# Patient Record
Sex: Male | Born: 1950 | Race: White | Hispanic: No | Marital: Married
Health system: Southern US, Community
[De-identification: ages and names within clinical notes are randomized; demographics above are authoritative.]

---

## 2008-02-09 ENCOUNTER — Emergency Department (HOSPITAL_COMMUNITY): Admission: EM | Admit: 2008-02-09 | Discharge: 2008-02-09 | Payer: Self-pay | Admitting: Family Medicine

## 2009-09-02 IMAGING — CT CT ABDOMEN W/O CM
2 of 4 series · 17 of 46 positions shown, 19 images · non-contrast
Comparison: None

CT ABDOMEN

CLINICAL DATA: Acute onset of severe left testicular pain

CT ABDOMEN AND PELVIS WITHOUT CONTRAST
TECHNIQUE: Multidetector CT imaging of the abdomen and pelvis was
performed following the standard
protocol without intravenous contrast.

[Series 2: <(id) w/o a/p >(id) · axial · non-contrast · 0.70mm/px · z∈[-475,-80]mm · 14 of 87 slices shown, 16 images]
[im 4/87  soft-tissue]
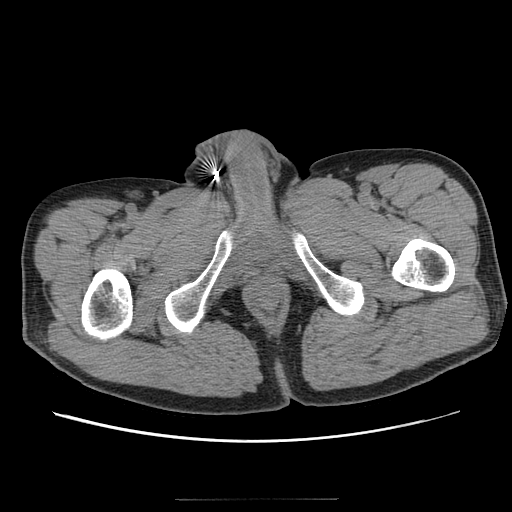
[im 4/87  bone]
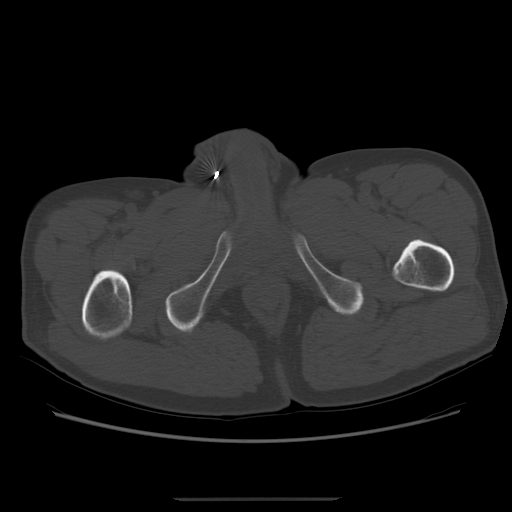
[im 10/87  soft-tissue]
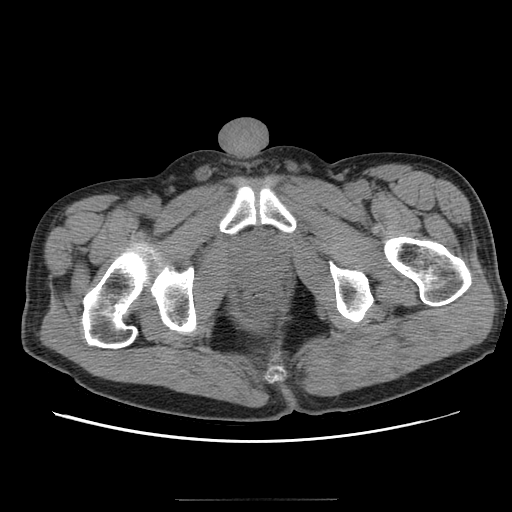
[im 17/87  soft-tissue]
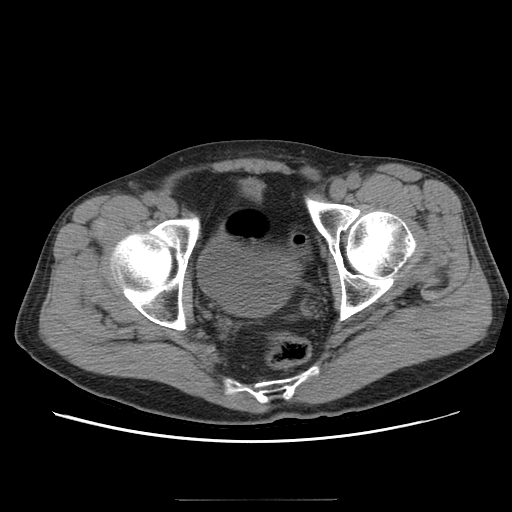
[im 24/87  soft-tissue]
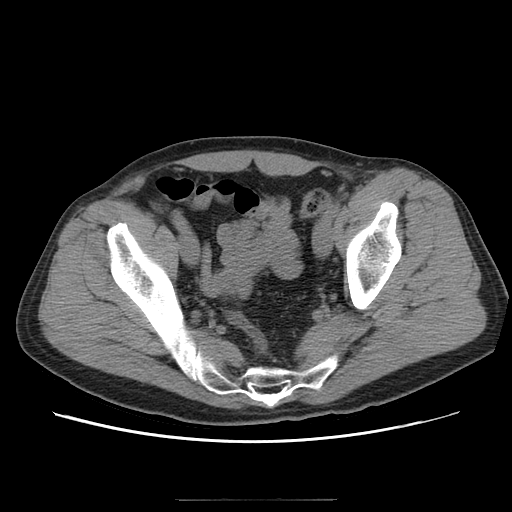
[im 30/87  soft-tissue]
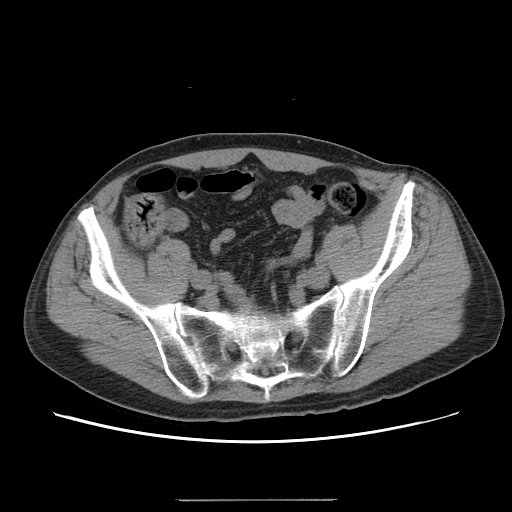
[im 34/87  soft-tissue]
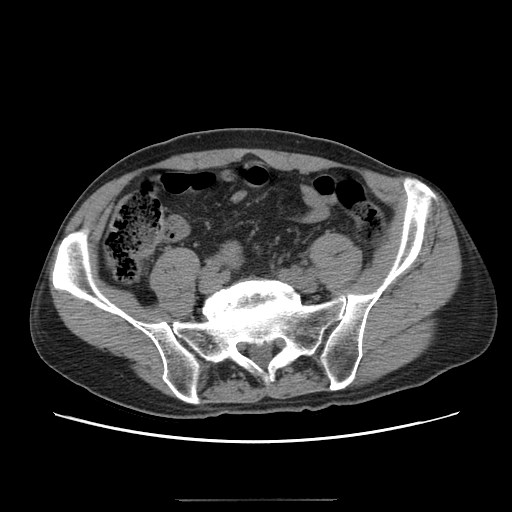
[im 40/87  soft-tissue]
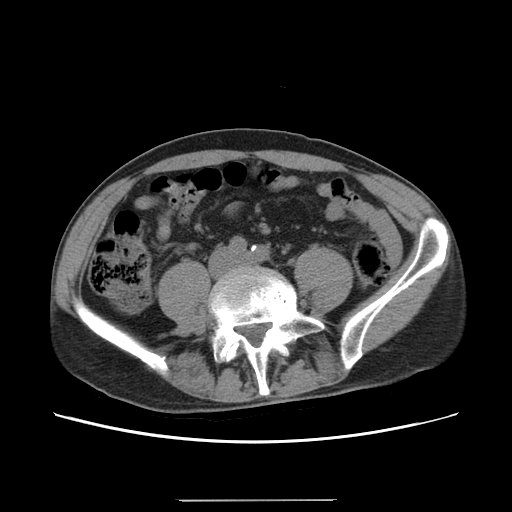
[im 47/87  soft-tissue]
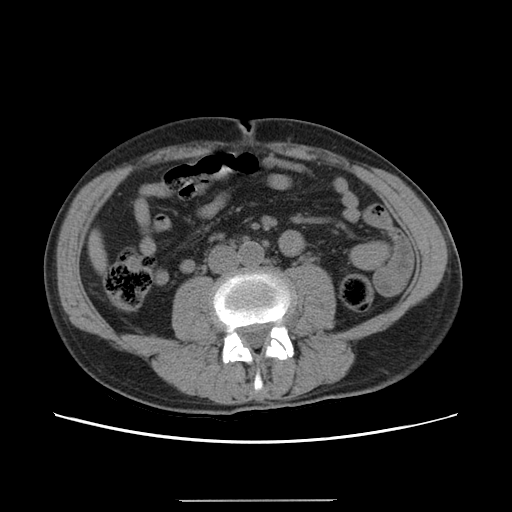
[im 53/87  soft-tissue]
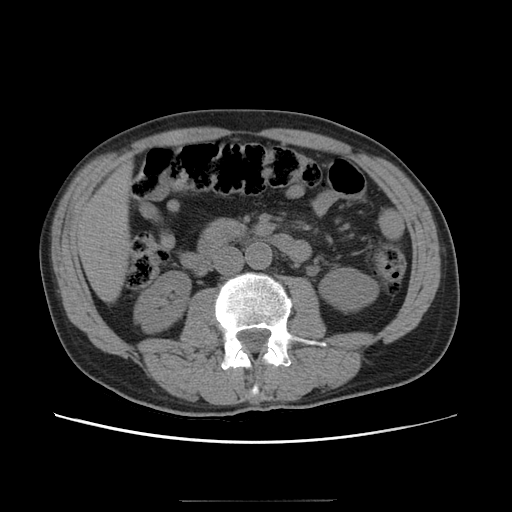
[im 53/87  bone]
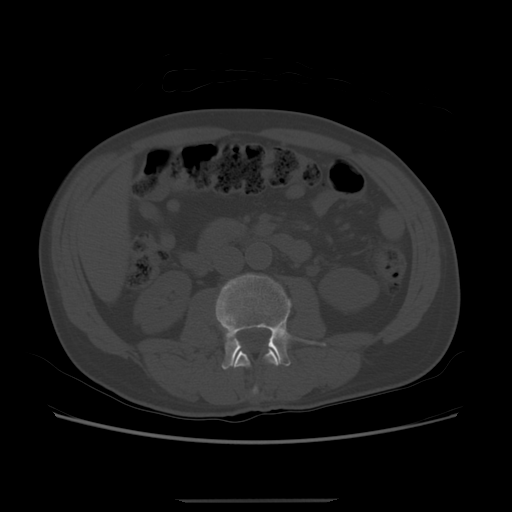
[im 57/87  soft-tissue]
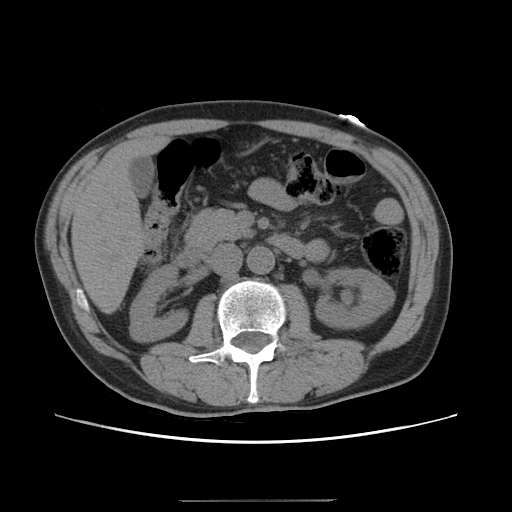
[im 63/87  soft-tissue]
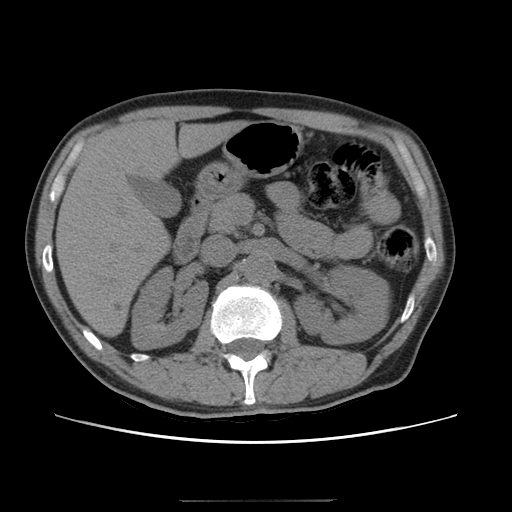
[im 70/87  soft-tissue]
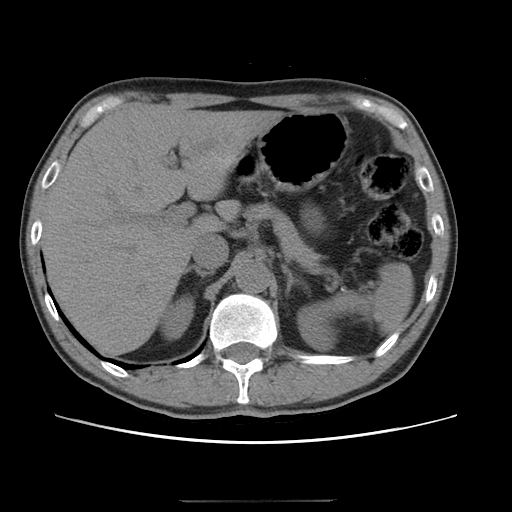
[im 77/87  soft-tissue]
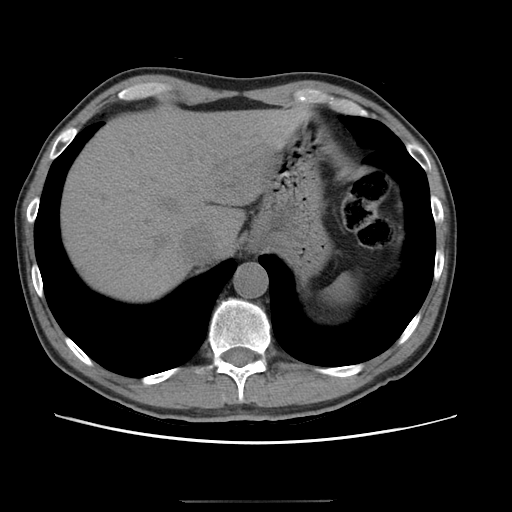
[im 83/87  soft-tissue]
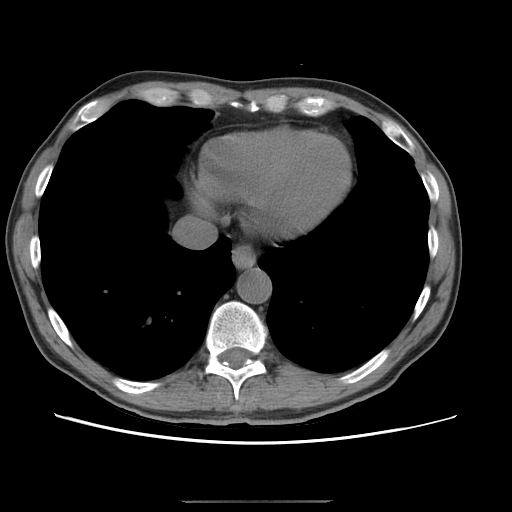

[Series 401: a/p cor · coronal · 0.87mm/px · 3 of 134 slices shown]
[im 45/134  soft-tissue]
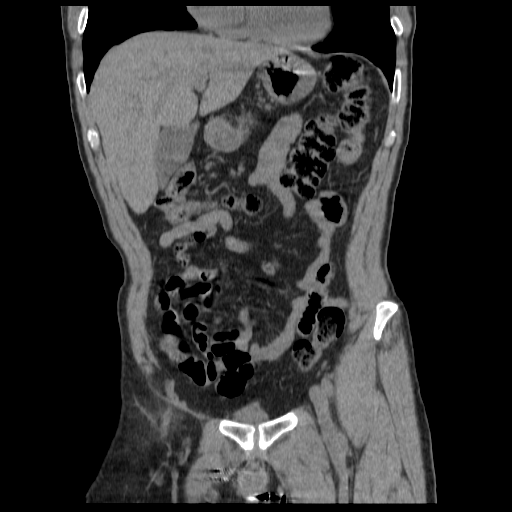
[im 60/134  soft-tissue]
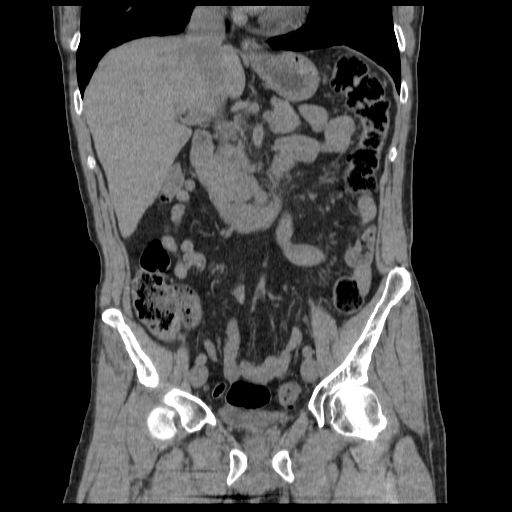
[im 74/134  soft-tissue]
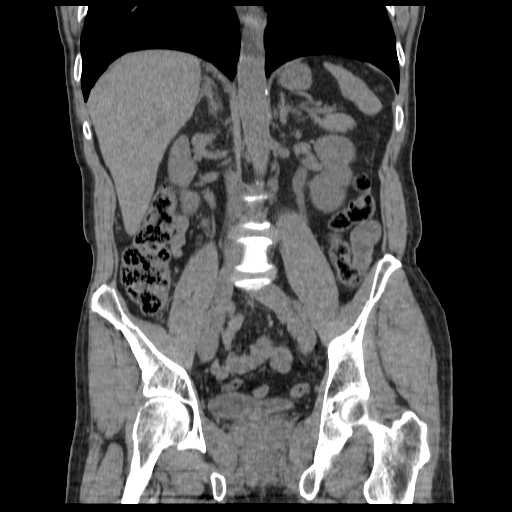

[17 of 46 positions shown; findings below may reference images not displayed]

FINDINGS: There is mild left hydronephrosis.  Left ureter is
dilated to the L4-5 level where there is a 3 mm stone in the
ureter.

There are no renal calculi.  There is no perinephric soft tissue
stranding.

The liver, spleen, pancreas, adrenal glands, and right kidney are
normal.  No dilated bowel.  No significant bony abnormality.
IMPRESSION: 3 mm stone obstructing the left ureter at the L4-5 level.

CT PELVIS
FINDINGS: There is no diverticular disease, free fluid, or
dilatation of the distal left ureter below the level of the stone
at the L4-5 level.  No significant bony abnormalities.
IMPRESSION: Benign-appearing pelvis.

## 2011-06-21 LAB — DIFFERENTIAL
Basophils Absolute: 0
Eosinophils Relative: 0
Lymphocytes Relative: 10 — ABNORMAL LOW
Monocytes Absolute: 0.5

## 2011-06-21 LAB — POCT I-STAT, CHEM 8
BUN: 20
Calcium, Ion: 1.19
Creatinine, Ser: 1.1
TCO2: 25

## 2011-06-21 LAB — URINE MICROSCOPIC-ADD ON

## 2011-06-21 LAB — CBC
HCT: 34.9 — ABNORMAL LOW
Hemoglobin: 12 — ABNORMAL LOW
RDW: 12.7

## 2011-06-21 LAB — URINALYSIS, ROUTINE W REFLEX MICROSCOPIC
Bilirubin Urine: NEGATIVE
Ketones, ur: NEGATIVE
Protein, ur: NEGATIVE
Specific Gravity, Urine: 1.015
Urobilinogen, UA: 0.2

## 2022-07-15 ENCOUNTER — Other Ambulatory Visit: Payer: Self-pay

## 2022-07-15 ENCOUNTER — Emergency Department (HOSPITAL_COMMUNITY): Payer: Medicare PPO

## 2022-07-15 ENCOUNTER — Other Ambulatory Visit (HOSPITAL_COMMUNITY): Payer: Self-pay

## 2022-07-15 ENCOUNTER — Emergency Department (HOSPITAL_COMMUNITY)
Admission: EM | Admit: 2022-07-15 | Discharge: 2022-07-16 | Disposition: A | Discharge status: Home / Self Care | Payer: Medicare PPO | Attending: Emergency Medicine | Admitting: Emergency Medicine

## 2022-07-15 DIAGNOSIS — D649 Anemia, unspecified: Secondary | ICD-10-CM | POA: Diagnosis not present

## 2022-07-15 DIAGNOSIS — R55 Syncope and collapse: Secondary | ICD-10-CM

## 2022-07-15 DIAGNOSIS — R079 Chest pain, unspecified: Secondary | ICD-10-CM | POA: Diagnosis present

## 2022-07-15 LAB — CBC
HCT: 33 % — ABNORMAL LOW (ref 39.0–52.0)
Hemoglobin: 10.1 g/dL — ABNORMAL LOW (ref 13.0–17.0)
MCH: 30.3 pg (ref 26.0–34.0)
MCHC: 30.6 g/dL (ref 30.0–36.0)
MCV: 99.1 fL (ref 80.0–100.0)
Platelets: 338 10*3/uL (ref 150–400)
RBC: 3.33 MIL/uL — ABNORMAL LOW (ref 4.22–5.81)
RDW: 15.5 % (ref 11.5–15.5)
WBC: 7.7 10*3/uL (ref 4.0–10.5)
nRBC: 0 % (ref 0.0–0.2)

## 2022-07-15 LAB — TROPONIN I (HIGH SENSITIVITY)
Troponin I (High Sensitivity): 4 ng/L (ref <18)
Troponin I (High Sensitivity): 4 ng/L (ref <18)

## 2022-07-15 LAB — BASIC METABOLIC PANEL
Anion gap: 10 (ref 5–15)
BUN: 15 mg/dL (ref 8–23)
CO2: 24 mmol/L (ref 22–32)
Calcium: 8.8 mg/dL — ABNORMAL LOW (ref 8.9–10.3)
Chloride: 106 mmol/L (ref 98–111)
Creatinine, Ser: 0.84 mg/dL (ref 0.61–1.24)
GFR, Estimated: >60 mL/min (ref >60)
Glucose, Bld: 109 mg/dL — ABNORMAL HIGH (ref 70–99)
Potassium: 4 mmol/L (ref 3.5–5.1)
Sodium: 140 mmol/L (ref 135–145)

## 2022-07-15 NOTE — ED Triage Notes (Addendum)
Patient w/ chest tightness while seated in a car on the way to a football pain. Per patient he had chest pain while seated, had a syncopal episode and then remembers having chest tightness upon waking up. Describes it as centrally located nonradiating. Denies SHOB. Felt nauseated after the syncopal episode.  Initial contact w/ EMS patient was gray and clammy, but this resided since.   124/70 HR-60 Spo2-96%  CBG normal  Patient was given 324 ASA PTA and 500 mls of NS through an 18 g PIV.

## 2022-07-15 NOTE — ED Provider Triage Note (Signed)
Emergency Medicine Provider Triage Evaluation Note  Kara Mierzejewski , a 71 y.o. male  was evaluated in triage.  Pt complains of chest tightness and syncope.  Reports on the way to a football game and while he was riding in the car he started to have some nonradiating chest discomfort.  Then endorses a syncopal episode.  EMS said patient was diaphoretic on the scene however his symptoms had resolved.  No cardiac history  Review of Systems  Positive: Asymptomatic now Negative:   Physical Exam  BP 125/68 (BP Location: Right Arm)   Pulse 62   Temp 98.3 F (36.8 C) (Oral)   Resp 16   SpO2 100%  Gen:   Awake, no distress   Resp:  Normal effort  MSK:   Moves extremities without difficulty  Other:  Alert and oriented, RRR, lung sounds clear  Medical Decision Making  Medically screening exam initiated at 4:12 PM.  Appropriate orders placed.  Bow Buntyn was informed that the remainder of the evaluation will be completed by another provider, this initial triage assessment does not replace that evaluation, and the importance of remaining in the ED until their evaluation is complete.     Rhae Hammock, [place] 07/15/22 [phone]

## 2022-07-16 ENCOUNTER — Encounter (HOSPITAL_COMMUNITY): Payer: Self-pay | Admitting: Student

## 2022-07-16 NOTE — ED Provider Notes (Signed)
[facility] EMERGENCY DEPARTMENT Provider Note   CSN: [id number] Arrival date & time: 07/15/22  1553     History  Chief Complaint  Patient presents with   Chest Pain   Loss of Consciousness    Grant Powell is a 71 y.o. male with a history of rheumatoid arthritis who presents to the emergency department with complaints of an episode of chest pain with syncope that occurred earlier today.  Patient reports that he was sitting at rest in the passenger front seat of a vehicle driving to [facility] when he started to get some chest tightness centrally, nonradiating, and subsequent lightheadedness/sensation of being overheated.  He had a subsequent syncopal episode which his wife who was driving witness, he was unconscious for less than a minute, when he came back around he states the pain had resolved but he did feel hot/sweaty.  EMS administered fluids.  Patient has felt better since.  He denies any recurrence of pain.  He denies any current lightheadedness, dizziness, nausea, vomiting, chest pain, or shortness of breath.  No dyspnea w/ sxs earlier either. Denies leg pain/swelling, hemoptysis, recent surgery/trauma, recent long travel, hormone use, personal hx of cancer, or hx of DVT/PE.  He recently completed several months of prednisone which was tapered, last dose was yesterday.  He is currently on methotrexate and folic acid for suspected rheumatoid arthritis.  No other prescription medications, no other med changes.  HPI     Home Medications Prior to Admission medications   Not on File      Allergies    Patient has no known allergies.    Review of Systems   Review of Systems  Constitutional:  Positive for diaphoresis. Negative for fever.  Respiratory:  Negative for shortness of breath.   Cardiovascular:  Positive for chest pain.  Gastrointestinal:  Negative for vomiting.  Neurological:  Positive for syncope.  All other systems reviewed and are  negative.   Physical Exam Updated Vital Signs BP 137/73 (BP Location: Left Arm)   Pulse 73   Temp 98 F (36.7 C)   Resp 18   SpO2 99%  Physical Exam Vitals and nursing note reviewed.  Constitutional:      General: He is not in acute distress.    Appearance: He is well-developed. He is not toxic-appearing.  HENT:     Head: Normocephalic and atraumatic.  Eyes:     General:        Right eye: No discharge.        Left eye: No discharge.     Conjunctiva/sclera: Conjunctivae normal.  Cardiovascular:     Rate and Rhythm: Normal rate and regular rhythm.     Pulses:          Radial pulses are 2+ on the right side and 2+ on the left side.     Heart sounds: No murmur heard. Pulmonary:     Effort: No respiratory distress.     Breath sounds: Normal breath sounds. No wheezing or rales.  Abdominal:     General: There is no distension.     Palpations: Abdomen is soft.     Tenderness: There is no abdominal tenderness.  Musculoskeletal:     Cervical back: Neck supple.     Right lower leg: No tenderness. No edema.     Left lower leg: No tenderness. No edema.  Skin:    General: Skin is warm and dry.  Neurological:     Mental Status: He is  alert.     Comments: Clear speech.   Psychiatric:        Behavior: Behavior normal.    ED Results / Procedures / Treatments   Labs (all labs ordered are listed, but only abnormal results are displayed) Labs Reviewed  BASIC METABOLIC PANEL - Abnormal; Notable for the following components:      Result Value   Glucose, Bld 109 (*)    Calcium 8.8 (*)    All other components within normal limits  CBC - Abnormal; Notable for the following components:   RBC 3.33 (*)    Hemoglobin 10.1 (*)    HCT 33.0 (*)    All other components within normal limits  CBG MONITORING, ED  TROPONIN I (HIGH SENSITIVITY)  TROPONIN I (HIGH SENSITIVITY)    EKG EKG Interpretation  Date/Time:  Saturday July 15 2022 16:04:42 EDT Ventricular Rate:  60 PR  Interval:  178 QRS Duration: 82 QT Interval:  388 QTC Calculation: 388 R Axis:   3 Text Interpretation: Normal sinus rhythm Normal ECG When compared with ECG of 09-Feb-2008 17:22, PREVIOUS ECG IS PRESENT No acute changes Confirmed by Drema Pry [phone]) on 07/16/2022 12:53:50 AM  Radiology DG Chest 2 View  Result Date: 07/15/2022 CLINICAL DATA:  Pt reports that he had an episode of mid chest pain and syncope earlier today while riding in the car on his way to a football game. Pt reports that he currently feels nauseous. EXAM: CHEST - 2 VIEW COMPARISON:  None available FINDINGS: Lungs are clear. Heart size and mediastinal contours are within normal limits. No effusion.  No pneumothorax. Visualized bones unremarkable. IMPRESSION: No acute cardiopulmonary disease. Electronically Signed   By: Corlis Leak M.D.   On: 07/15/2022 17:03    Procedures Procedures    Medications Ordered in ED Medications - No data to display  ED Course/ Medical Decision Making/ A&P                           Medical Decision Making  Patient presents to the ED with complaints of chest pain & syncope earlier today, this involves an extensive number of treatment options, and is a complaint that carries with it a high risk of complications and morbidity. Nontoxic, vitals WNL.   Additional history obtained:  Chart/nursing notes reviewed.  External records viewed including: prior labs  EKG: Normal sinus rhythm Normal ECG When compared with ECG of 09-Feb-2008 17:22, PREVIOUS ECG IS PRESENT No acute changes   Lab Tests:  I viewed & interpreted labs including:  CBC: Mildly worsened anemia BMP: Mild hypocalcemia, no critical electrolyte derangement Troponins: No significant elevation, flat  Imaging Studies:  I viewed the following imaging, agree with radiologist impression:  Chest x-ray: No acute cardiopulmonary disease.  ED Course:  Patient has been present in the emergency department for over 9 hours after  an extended wait time in the waiting room.  He has not had any recurrence of symptoms, he feels at baseline.  On my assessment he was standing next to his hallway bed ambulating in no acute distress.  His EKG does not show acute ischemia and his troponins are flat, I have a lower suspicion for ACS.  He is low risk for Wells criteria for pulmonary embolism, my attending Dr. Eudelia Bunch did discuss the option of further assessment for possible pulmonary embolism with D-dimer/cta with the patient, engaged in shared decision-making and ultimately will forgo this, suspicion for PE is low  in the setting of no hypoxia, tachycardia or hypotension.  Chest x-ray without pneumothorax, acute fluid overload, or pneumonia.  Labs with mild anemia, not critical.  No critical electrolyte derangement.  Given patient feels back to baseline and has not had recurrent symptoms with a reassuring work-up feel he is reasonable for discharge with strict return precautions and close follow-up.  I discussed results, treatment plan, need for follow-up, and return precautions with the patient. Provided opportunity for questions, patient confirmed understanding and is in agreement with plan.   This is a shared visit with supervising physician Dr. Eudelia Bunch who has independently evaluated patient & provided guidance in evaluation/management/disposition, in agreement with care   Blood pressure 132/77, pulse 64, temperature 98 F (36.7 C), resp. rate 18, SpO2 100 %.   Portions of this note were generated with Scientist, clinical (histocompatibility and immunogenetics). Dictation errors may occur despite best attempts at proofreading.  Final Clinical Impression(s) / ED Diagnoses Final diagnoses:  Syncope, unspecified syncope type  Chest pain, unspecified type  Anemia, unspecified type    Rx / DC Orders ED Discharge Orders          Ordered    Ambulatory referral to Cardiology        07/16/22 0128              Cherly Anderson, PA-C 07/16/22 0358     Nira Conn, MD 07/16/22 [phone]

## 2022-07-16 NOTE — Discharge Instructions (Signed)
You were seen in the emergency department today for chest pain and passing out. Your work-up in the emergency department has been overall reassuring. Your labs have been fairly normal and or similar to previous blood work you have had done- you do have mild anemia and a mildly low calcium level - please have these rechecked by your primary care provider. Your EKG and the enzyme we use to check your heart did not show an acute heart attack at this time. Your chest x-ray was normal.   We would like you to follow up closely with your primary care provider and/or cardiology within 1-3 days. We have sent a referral to cardiology. Return to the ER immediately should you experience any new or worsening symptoms including but not limited to return of pain, worsened pain, vomiting, shortness of breath, dizziness, lightheadedness, passing out, or any other concerns that you may have.
